# Patient Record
Sex: Female | Born: 2000 | Race: Black or African American | Hispanic: No | Marital: Single | Smoking: Never smoker
Health system: Southern US, Community
[De-identification: ages and names within clinical notes are randomized; demographics above are authoritative.]

---

## 2018-08-22 ENCOUNTER — Emergency Department (HOSPITAL_COMMUNITY): Payer: BLUE CROSS/BLUE SHIELD

## 2018-08-22 ENCOUNTER — Emergency Department (HOSPITAL_COMMUNITY)
Admission: EM | Admit: 2018-08-22 | Discharge: 2018-08-22 | Disposition: A | Discharge status: Home / Self Care | Payer: BLUE CROSS/BLUE SHIELD | Attending: Emergency Medicine | Admitting: Emergency Medicine

## 2018-08-22 ENCOUNTER — Encounter (HOSPITAL_COMMUNITY): Payer: Self-pay | Admitting: Emergency Medicine

## 2018-08-22 ENCOUNTER — Other Ambulatory Visit: Payer: Self-pay

## 2018-08-22 DIAGNOSIS — Z79899 Other long term (current) drug therapy: Secondary | ICD-10-CM | POA: Insufficient documentation

## 2018-08-22 DIAGNOSIS — Y999 Unspecified external cause status: Secondary | ICD-10-CM | POA: Diagnosis not present

## 2018-08-22 DIAGNOSIS — X509XXA Other and unspecified overexertion or strenuous movements or postures, initial encounter: Secondary | ICD-10-CM | POA: Diagnosis not present

## 2018-08-22 DIAGNOSIS — Y9389 Activity, other specified: Secondary | ICD-10-CM | POA: Diagnosis not present

## 2018-08-22 DIAGNOSIS — S43004A Unspecified dislocation of right shoulder joint, initial encounter: Secondary | ICD-10-CM | POA: Diagnosis not present

## 2018-08-22 DIAGNOSIS — Y929 Unspecified place or not applicable: Secondary | ICD-10-CM | POA: Diagnosis not present

## 2018-08-22 DIAGNOSIS — S4991XA Unspecified injury of right shoulder and upper arm, initial encounter: Secondary | ICD-10-CM | POA: Diagnosis present

## 2018-08-22 MED ORDER — HYDROMORPHONE HCL 1 MG/ML IJ SOLN
0.5000 mg | Freq: Once | INTRAMUSCULAR | Status: AC
  Administered 2018-08-22: 0.5 mg via INTRAVENOUS
  Filled 2018-08-22: qty 1

## 2018-08-22 NOTE — ED Provider Notes (Signed)
[facility]-EMERGENCY DEPT Provider Note   CSN: [id number] Arrival date & time: 08/22/18  2053     History   Chief Complaint Chief Complaint  Patient presents with  . Shoulder Injury    HPI Pamela Cruz is a 18 y.o. female.  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  States she was stretching at 1945 this evening when she felt like her shoulder dislocated.  She is had persistent pain since.  She denies any numbness to the arm or weakness. History reviewed. No pertinent past medical history.  There are no active problems to display for this patient.   History reviewed. No pertinent surgical history.   OB History   No obstetric history on file.      Home Medications    Prior to Admission medications   Medication Sig Start Date End Date Taking? Authorizing Provider  medroxyPROGESTERone (DEPO-PROVERA) 150 MG/ML injection Inject 150 mg into the muscle every 3 (three) months.   Yes [provider]    Family History History reviewed. No pertinent family history.  Social History Social History   Tobacco Use  . Smoking status: Never Smoker  . Smokeless tobacco: Never Used  Substance Use Topics  . Alcohol use: Never    Frequency: Never  . Drug use: Never     Allergies   Patient has no known allergies.   Review of Systems Review of Systems  Constitutional: Negative for chills and fever.  Respiratory: Negative for cough and shortness of breath.   Cardiovascular: Negative for chest pain.  Gastrointestinal: Negative for abdominal pain, diarrhea, nausea and vomiting.  Musculoskeletal: Positive for arthralgias. Negative for back pain, myalgias and neck pain.  Skin: Negative for rash and wound.  Neurological: Negative for weakness and numbness.  All other systems reviewed and are negative.    Physical Exam Updated Vital Signs BP 116/78 (BP Location: Left  Arm)   Pulse 79   Temp 98.2 F (36.8 C) (Oral)   Resp 19   Ht 5\' 8"  (1.727 m)   Wt 65.8 kg   SpO2 99%   BMI 22.05 kg/m   Physical Exam Vitals signs and nursing note reviewed.  Constitutional:      Appearance: She is well-developed.  HENT:     Head: Normocephalic and atraumatic.     Nose: Nose normal.     Mouth/Throat:     Mouth: Mucous membranes are moist.  Eyes:     Extraocular Movements: Extraocular movements intact.     Pupils: Pupils are equal, round, and reactive to light.  Neck:     Musculoskeletal: Normal range of motion and neck supple.  Cardiovascular:     Rate and Rhythm: Normal rate and regular rhythm.  Pulmonary:     Effort: Pulmonary effort is normal.     Breath sounds: Normal breath sounds.  Abdominal:     General: Bowel sounds are normal.     Palpations: Abdomen is soft.     Tenderness: There is no abdominal tenderness. There is no guarding or rebound.  Musculoskeletal: Normal range of motion.        General: Deformity present. No swelling or tenderness.     Comments: Step-off deformity to the right shoulder.  Arm is held in adduction.  Distal pulses intact.  Skin:    General: Skin is warm and dry.     Capillary Refill: Capillary refill takes less than 2  seconds.     Findings: No erythema or rash.  Neurological:     General: No focal deficit present.     Mental Status: She is alert and oriented to person, place, and time.     Comments: Sensation of the triceps is intact.  Good bilateral grip strength.  Psychiatric:        Mood and Affect: Mood normal.        Behavior: Behavior normal.      ED Treatments / Results  Labs (all labs ordered are listed, but only abnormal results are displayed) Labs Reviewed - No data to display  EKG None  Radiology Dg Shoulder Right  Result Date: 08/22/2018 CLINICAL DATA:  Recent shoulder injury with history of dislocation EXAM: RIGHT SHOULDER - 2+ VIEW COMPARISON:  None. FINDINGS: Mild anterior inferior  dislocation of the humeral head is noted with respect to the glenoid. No acute fracture is seen. No other focal abnormality is noted. IMPRESSION: Anterior inferior dislocation of the right humeral head. Electronically Signed   By: Alcide Clever M.D.   On: 08/22/2018 21:34   Dg Shoulder Right Port  Result Date: 08/22/2018 CLINICAL DATA:  Post reduction EXAM: PORTABLE RIGHT SHOULDER COMPARISON:  08/22/2018 FINDINGS: Reduction of previously noted anterior shoulder dislocation now with normal alignment. No fracture is seen. IMPRESSION: Reduction of shoulder dislocation.  No acute displaced fracture Electronically Signed   By: Jasmine Pang M.D.   On: 08/22/2018 22:35    Procedures Reduction of dislocation Date/Time: 08/22/2018 10:50 PM Performed by: Loren Racer, MD Authorized by: Loren Racer, MD  Local anesthesia used: no  Anesthesia: Local anesthesia used: no  Sedation: Patient sedated: no  Patient tolerance: Patient tolerated the procedure well with no immediate complications Comments: Reduced with Stimson's maneuver.  Neurovascular intact.  Confirmed on reduction confirmed on x-ray.  Placed in sling.    (including critical care time)  Medications Ordered in ED Medications  HYDROmorphone (DILAUDID) injection 0.5 mg (0.5 mg Intravenous Given 08/22/18 2148)     Initial Impression / Assessment and Plan / ED Course  I have reviewed the triage vital signs and the nursing notes.  Pertinent labs & imaging results that were available during my care of the patient were reviewed by me and considered in my medical decision making (see chart for details).     Right shoulder dislocation reduced in the emergency department.  Placed in sling and will have follow-up with Ortho.  Return precautions given.  Final Clinical Impressions(s) / ED Diagnoses   Final diagnoses:  Dislocation of right shoulder joint, initial encounter    ED Discharge Orders    None       Loren Racer, MD 08/22/18 2251

## 2018-08-22 NOTE — ED Triage Notes (Signed)
Per EMS, Pt was stretching and pts right arm caught and was unable able to adduct arm toward body. Pt has htx of problems with shoulder, pt now able to move arm some after fentanyl administration.

## 2018-08-22 NOTE — ED Notes (Signed)
Bed: OZ36 Expected date:  Expected time:  Means of arrival:  Comments: 18 yr old dislocated shoulder

## 2020-05-02 IMAGING — DX DG SHOULDER 2+V PORT*R*
2 series · 2 of 2 positions shown · non-contrast
Comparison: 08/22/2018

CLINICAL DATA: Post reduction

EXAM:
PORTABLE RIGHT SHOULDER

[abdomen kub (1 of 2)]
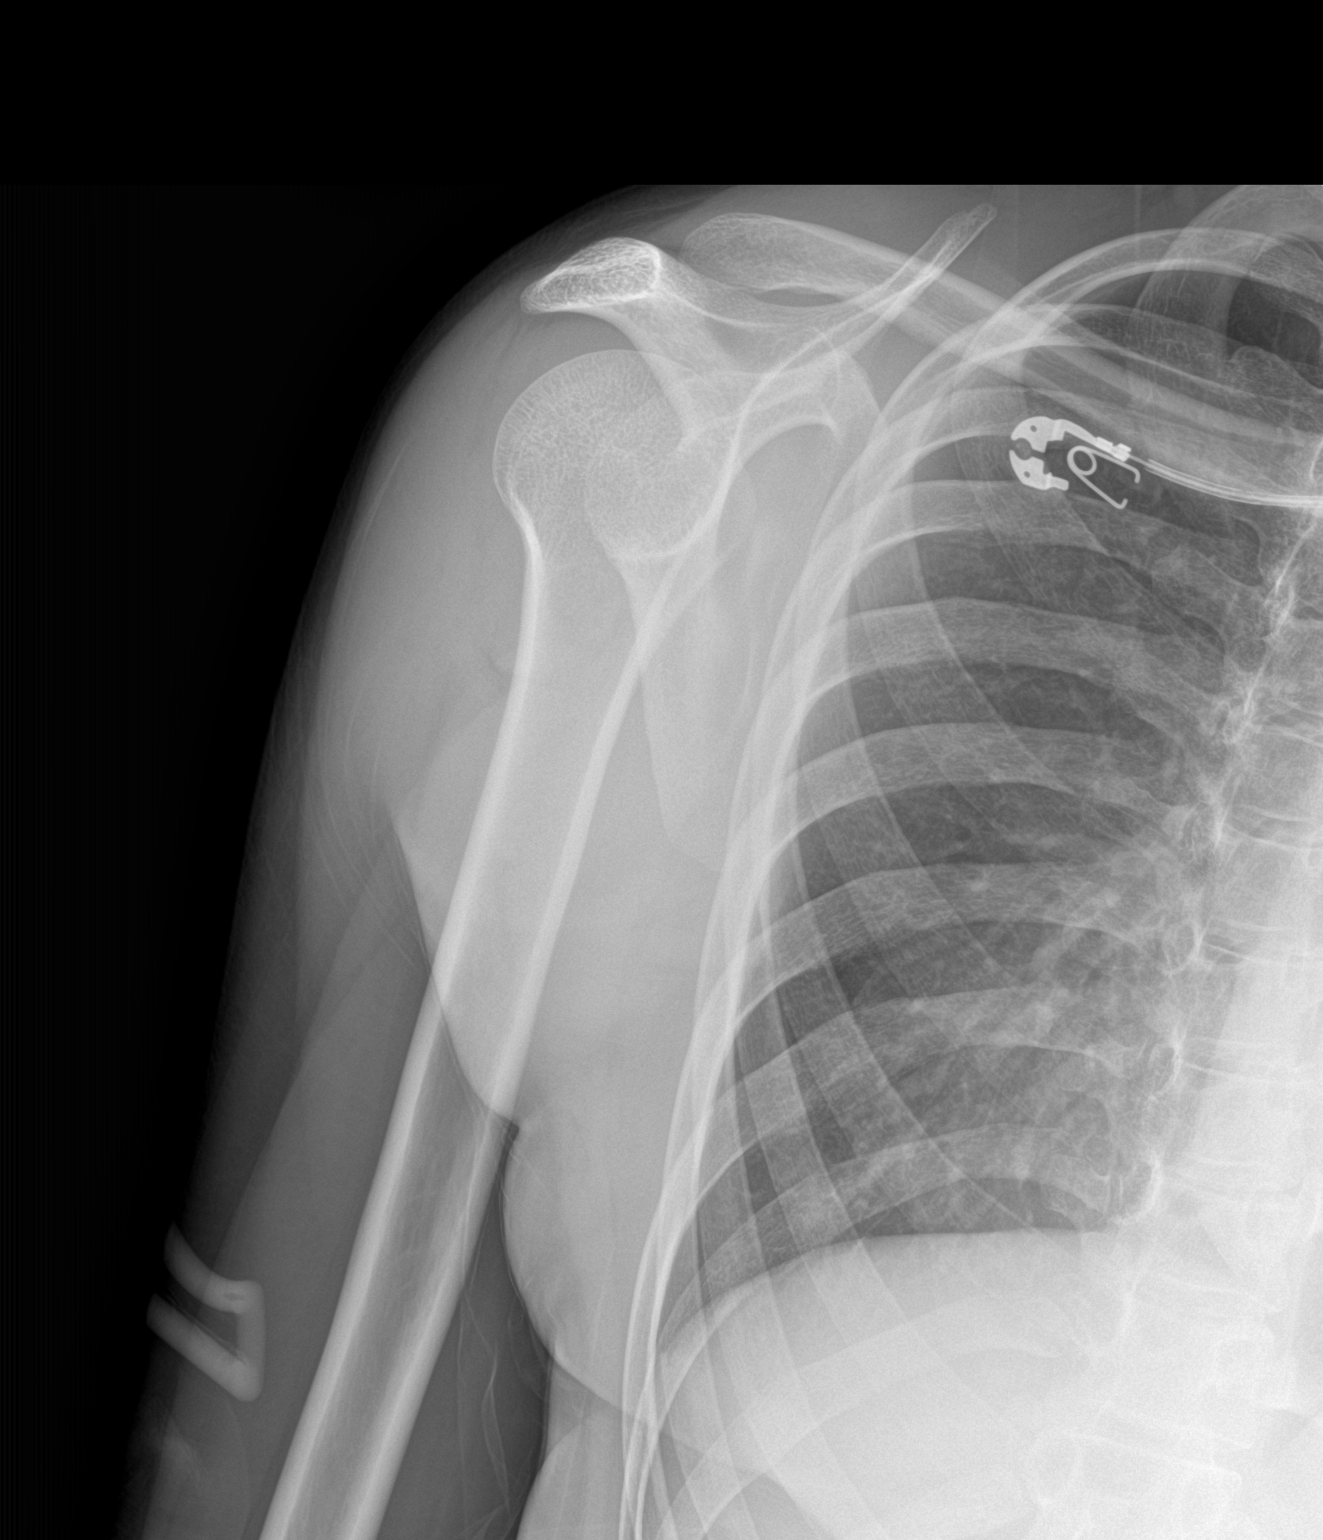

[abdomen kub (2 of 2)]
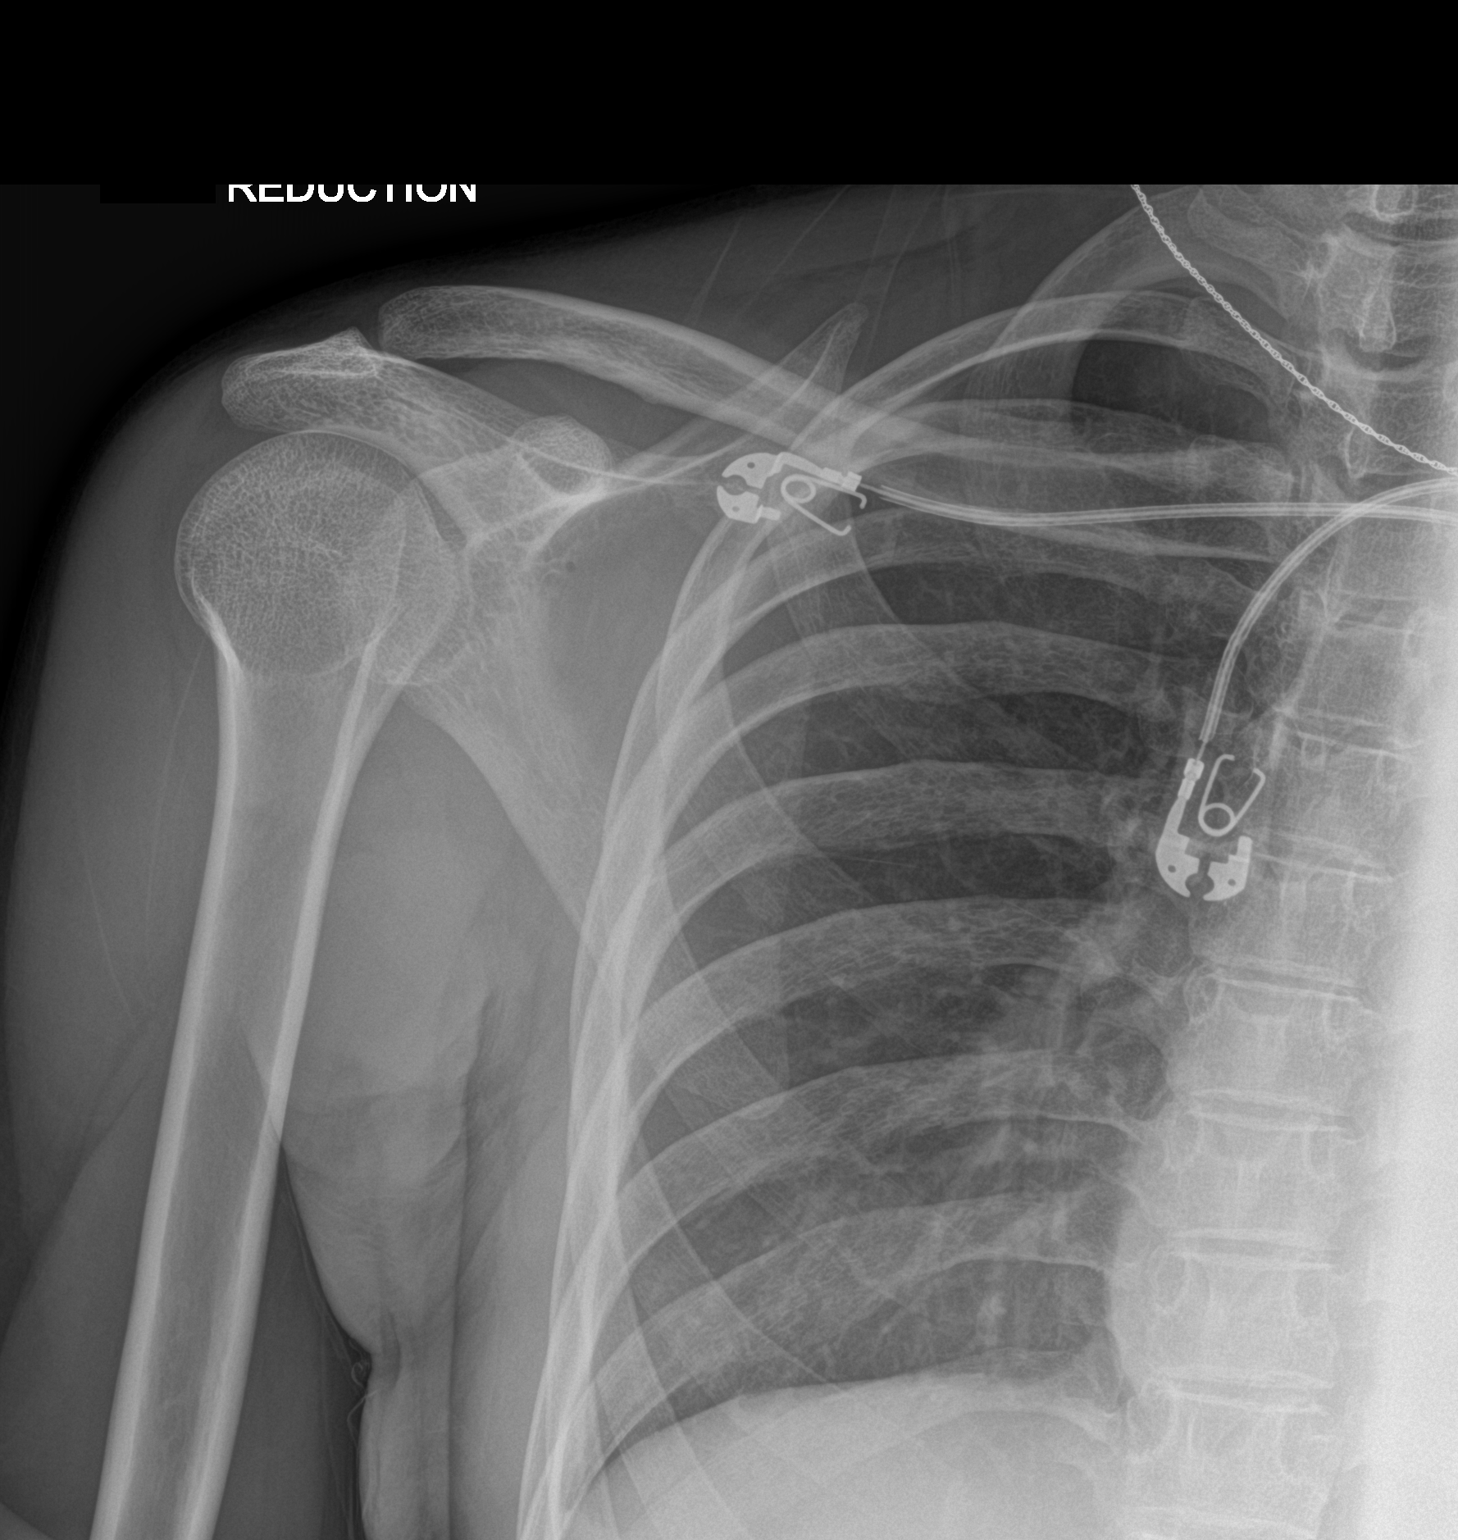

[2 of 2 positions shown; findings below may reference images not displayed]

FINDINGS: Reduction of previously noted anterior shoulder dislocation now with
normal alignment. No fracture is seen.
IMPRESSION: Reduction of shoulder dislocation.  No acute displaced fracture
